# Patient Record
Sex: Female | Born: 1990 | Race: Black or African American | Hispanic: No | Marital: Single | Smoking: Never smoker
Health system: Southern US, Community
[De-identification: ages and names within clinical notes are randomized; demographics above are authoritative.]

## PROBLEM LIST (undated history)

## (undated) DIAGNOSIS — Z9109 Other allergy status, other than to drugs and biological substances: Secondary | ICD-10-CM

---

## 2014-08-27 ENCOUNTER — Emergency Department (HOSPITAL_COMMUNITY): Payer: No Typology Code available for payment source

## 2014-08-27 ENCOUNTER — Emergency Department (HOSPITAL_COMMUNITY)
Admission: EM | Admit: 2014-08-27 | Discharge: 2014-08-27 | Disposition: A | Discharge status: Home / Self Care | Payer: No Typology Code available for payment source | Attending: Emergency Medicine | Admitting: Emergency Medicine

## 2014-08-27 ENCOUNTER — Encounter (HOSPITAL_COMMUNITY): Payer: Self-pay | Admitting: Emergency Medicine

## 2014-08-27 DIAGNOSIS — Z9981 Dependence on supplemental oxygen: Secondary | ICD-10-CM | POA: Insufficient documentation

## 2014-08-27 DIAGNOSIS — S42032A Displaced fracture of lateral end of left clavicle, initial encounter for closed fracture: Secondary | ICD-10-CM | POA: Insufficient documentation

## 2014-08-27 DIAGNOSIS — Z3202 Encounter for pregnancy test, result negative: Secondary | ICD-10-CM | POA: Insufficient documentation

## 2014-08-27 DIAGNOSIS — S20312A Abrasion of left front wall of thorax, initial encounter: Secondary | ICD-10-CM | POA: Insufficient documentation

## 2014-08-27 DIAGNOSIS — S32059A Unspecified fracture of fifth lumbar vertebra, initial encounter for closed fracture: Secondary | ICD-10-CM | POA: Diagnosis not present

## 2014-08-27 DIAGNOSIS — S2232XA Fracture of one rib, left side, initial encounter for closed fracture: Secondary | ICD-10-CM | POA: Diagnosis not present

## 2014-08-27 DIAGNOSIS — Y9389 Activity, other specified: Secondary | ICD-10-CM | POA: Diagnosis not present

## 2014-08-27 DIAGNOSIS — Y92410 Unspecified street and highway as the place of occurrence of the external cause: Secondary | ICD-10-CM | POA: Insufficient documentation

## 2014-08-27 DIAGNOSIS — S42002A Fracture of unspecified part of left clavicle, initial encounter for closed fracture: Secondary | ICD-10-CM

## 2014-08-27 DIAGNOSIS — S32050A Wedge compression fracture of fifth lumbar vertebra, initial encounter for closed fracture: Secondary | ICD-10-CM

## 2014-08-27 DIAGNOSIS — S3992XA Unspecified injury of lower back, initial encounter: Secondary | ICD-10-CM | POA: Diagnosis present

## 2014-08-27 LAB — COMPREHENSIVE METABOLIC PANEL
ALT: 30 U/L (ref 0–35)
AST: 54 U/L — ABNORMAL HIGH (ref 0–37)
Albumin: 4.2 g/dL (ref 3.5–5.2)
Alkaline Phosphatase: 45 U/L (ref 39–117)
Anion gap: 14 (ref 5–15)
BILIRUBIN TOTAL: 0.5 mg/dL (ref 0.3–1.2)
BUN: 12 mg/dL (ref 6–23)
CO2: 22 mEq/L (ref 19–32)
Calcium: 8.7 mg/dL (ref 8.4–10.5)
Chloride: 104 mEq/L (ref 96–112)
Creatinine, Ser: 0.55 mg/dL (ref 0.50–1.10)
GFR calc Af Amer: >90 mL/min (ref >90)
GFR calc non Af Amer: >90 mL/min (ref >90)
Glucose, Bld: 91 mg/dL (ref 70–99)
POTASSIUM: 3.9 meq/L (ref 3.7–5.3)
SODIUM: 140 meq/L (ref 137–147)
Total Protein: 8.3 g/dL (ref 6.0–8.3)

## 2014-08-27 LAB — CBC WITH DIFFERENTIAL/PLATELET
BASOS ABS: 0 10*3/uL (ref 0.0–0.1)
Basophils Relative: 0 % (ref 0–1)
EOS ABS: 0 10*3/uL (ref 0.0–0.7)
Eosinophils Relative: 0 % (ref 0–5)
HCT: 34 % — ABNORMAL LOW (ref 36.0–46.0)
Hemoglobin: 11 g/dL — ABNORMAL LOW (ref 12.0–15.0)
Lymphocytes Relative: 10 % — ABNORMAL LOW (ref 12–46)
Lymphs Abs: 0.9 10*3/uL (ref 0.7–4.0)
MCH: 29.2 pg (ref 26.0–34.0)
MCHC: 32.4 g/dL (ref 30.0–36.0)
MCV: 90.2 fL (ref 78.0–100.0)
Monocytes Absolute: 0.3 10*3/uL (ref 0.1–1.0)
Monocytes Relative: 3 % (ref 3–12)
NEUTROS PCT: 87 % — AB (ref 43–77)
Neutro Abs: 8.5 10*3/uL — ABNORMAL HIGH (ref 1.7–7.7)
PLATELETS: 200 10*3/uL (ref 150–400)
RBC: 3.77 MIL/uL — ABNORMAL LOW (ref 3.87–5.11)
RDW: 12 % (ref 11.5–15.5)
WBC: 9.6 10*3/uL (ref 4.0–10.5)

## 2014-08-27 LAB — URINALYSIS, ROUTINE W REFLEX MICROSCOPIC
Bilirubin Urine: NEGATIVE
GLUCOSE, UA: NEGATIVE mg/dL
KETONES UR: 40 mg/dL — AB
Nitrite: NEGATIVE
PROTEIN: 30 mg/dL — AB
Specific Gravity, Urine: 1.03 (ref 1.005–1.030)
UROBILINOGEN UA: 1 mg/dL (ref 0.0–1.0)
pH: 5.5 (ref 5.0–8.0)

## 2014-08-27 LAB — URINE MICROSCOPIC-ADD ON

## 2014-08-27 LAB — POC URINE PREG, ED: Preg Test, Ur: NEGATIVE

## 2014-08-27 MED ORDER — IOHEXOL 300 MG/ML  SOLN
100.0000 mL | Freq: Once | INTRAMUSCULAR | Status: AC | PRN
  Administered 2014-08-27: 100 mL via INTRAVENOUS

## 2014-08-27 MED ORDER — HYDROCODONE-ACETAMINOPHEN 5-325 MG PO TABS
1.0000 | ORAL_TABLET | Freq: Once | ORAL | Status: AC
  Administered 2014-08-27: 1 via ORAL
  Filled 2014-08-27: qty 1

## 2014-08-27 MED ORDER — OXYCODONE-ACETAMINOPHEN 5-325 MG PO TABS: 1.0000 | ORAL_TABLET | ORAL | Status: DC | PRN

## 2014-08-27 MED ORDER — HYDROMORPHONE HCL 1 MG/ML IJ SOLN
1.0000 mg | Freq: Once | INTRAMUSCULAR | Status: DC
  Filled 2014-08-27: qty 1

## 2014-08-27 MED ORDER — SODIUM CHLORIDE 0.9 % IV BOLUS (SEPSIS)
1000.0000 mL | Freq: Once | INTRAVENOUS | Status: AC
  Administered 2014-08-27: 1000 mL via INTRAVENOUS
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
## 2014-08-27 NOTE — ED Notes (Signed)
Patient is aware that we need a urine specimen- will try again soon
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
## 2014-08-27 NOTE — ED Notes (Signed)
Ortho called 

## 2014-08-27 NOTE — ED Notes (Signed)
Patient transported to CT 

## 2014-08-27 NOTE — ED Notes (Signed)
Pt in xray.  Per friend, pain has not improved.

## 2014-08-27 NOTE — Discharge Instructions (Signed)
Back, Compression Fracture A compression fracture happens when a force is put upon the length of your spine. Slipping and falling on your bottom are examples of such a force. When this happens, sometimes the force is great enough to compress the building blocks (vertebral bodies) of your spine. Although this causes a lot of pain, this can usually be treated at home, unless your caregiver feels hospitalization is needed for pain control. Your backbone (spinal column) is made up of 24 main vertebral bodies in addition to the sacrum and coccyx (see illustration). These are held together by tough fibrous tissues (ligaments) and by support of your muscles. Nerve roots pass through the openings between the vertebrae. A sudden wrenching move, injury, or a fall may cause a compression fracture of one of the vertebral bodies. This may result in back pain or spread of pain into the belly (abdomen), the buttocks, and down the leg into the foot. Pain may also be created by muscle spasm alone. Large studies have been undertaken to determine the best possible course of action to help your back following injury and also to prevent future problems. The recommendations are as follows. FOLLOWING A COMPRESSION FRACTURE: Do the following only if advised by your caregiver.   If a back brace has been suggested or provided, wear it as directed.  Do not stop wearing the back brace unless instructed by your caregiver.  When allowed to return to regular activities, avoid a sedentary lifestyle. Actively exercise. Sporadic weekend binges of tennis, racquetball, or waterskiing may actually aggravate or create problems, especially if you are not in condition for that activity.  Avoid sports requiring sudden body movements until you are in condition for them. Swimming and walking are safer activities.  Maintain good posture.  Avoid obesity.  If not already done, you should have a DEXA scan. Based on the results, be treated for  osteoporosis. FOLLOWING ACUTE (SUDDEN) INJURY:  Only take over-the-counter or prescription medicines for pain, discomfort, or fever as directed by your caregiver.  Use bed rest for only the most extreme acute episode. Prolonged bed rest may aggravate your condition. Ice used for acute conditions is effective. Use a large plastic bag filled with ice. Wrap it in a towel. This also provides excellent pain relief. This may be continuous. Or use it for 30 minutes every 2 hours during acute phase, then as needed. Heat for 30 minutes prior to activities is helpful.  As soon as the acute phase (the time when your back is too painful for you to do normal activities) is over, it is important to resume normal activities and work Tourist information centre manager. Back injuries can cause potentially marked changes in lifestyle. So it is important to attack these problems aggressively.  See your caregiver for continued problems. He or she can help or refer you for appropriate exercises, physical therapy, and work hardening if needed.  If you are given narcotic medications for your condition, for the next 24 hours do not:  Drive.  Operate machinery or power tools.  Sign legal documents.  Do not drink alcohol, or take sleeping pills or other medications that may interfere with treatment. If your caregiver has given you a follow-up appointment, it is very important to keep that appointment. Not keeping the appointment could result in a chronic or permanent injury, pain, and disability. If there is any problem keeping the appointment, you must call back to this facility for assistance.  SEEK IMMEDIATE MEDICAL CARE IF:  You develop numbness,  tingling, weakness, or problems with the use of your arms or legs.  You develop severe back pain not relieved with medications.  You have changes in bowel or bladder control.  You have increasing pain in any areas of the body. Document Released: 10/13/2005 Document Revised:  02/27/2014 Document Reviewed: 05/17/2008 [facility] PartnershipExitCare Patient Information 2015 [place], [place]. This information is not intended to replace advice given to you by your health care provider. Make sure you discuss any questions you have with your health care provider.  Clavicle Fracture The clavicle, also called the collarbone, is the long bone that connects your shoulder to your rib cage. You can feel your collarbone at the top of your shoulders and rib cage. A clavicle fracture is a broken clavicle. It is a common injury that can happen at any age.  CAUSES Common causes of a clavicle fracture include:  A direct blow to your shoulder.  A car accident.  A fall, especially if you try to break your fall with an outstretched arm. RISK FACTORS You may be at increased risk if:  You are younger than 25 years or older than 75 years. Most clavicle fractures happen to people who are younger than 25 years.  You are a female.  You play contact sports. SIGNS AND SYMPTOMS A fractured clavicle is painful. It also makes it hard to move your arm. Other signs and symptoms may include:  A shoulder that drops downward and forward.  Pain when trying to lift your shoulder.  Bruising, swelling, and tenderness over your clavicle.  A grinding noise when you try to move your shoulder.  A bump over your clavicle. DIAGNOSIS Your health care provider can usually diagnose a clavicle fracture by asking about your injury and examining your shoulder and clavicle. He or she may take an X-ray to determine the position of your clavicle. TREATMENT Treatment depends on the position of your clavicle after the fracture:  If the broken ends of the bone are not out of place, your health care provider may put your arm in a sling or wrap a support bandage around your chest (figure-of-eight wrap).  If the broken ends of the bone are out of place, you may need surgery. Surgery may involve placing screws, pins, or plates to keep  your clavicle stable while it heals. Healing may take about 3 months. When your health care provider thinks your fracture has healed enough, you may have to do physical therapy to regain normal movement and build up your arm strength. HOME CARE INSTRUCTIONS   Apply ice to the injured area:  Put ice in a plastic bag.  Place a towel between your skin and the bag.  Leave the ice on for 20 minutes, 2-3 times a day.  If you have a wrap or splint:  Wear it all the time, and remove it only to take a bath or shower.  When you bathe or shower, keep your shoulder in the same position as when the sling or wrap is on.  Do not lift your arm.  If you have a figure-of-eight wrap:  Another person must tighten it every day.  It should be tight enough to hold your shoulders back.  Allow enough room to place your index finger between your body and the strap.  Loosen the wrap immediately if you feel numbness or tingling in your hands.  Only take medicines as directed by your health care provider.  Avoid activities that make the injury or pain worse for 4-6  weeks after surgery.  Keep all follow-up appointments. SEEK MEDICAL CARE IF:  Your medicine is not helping to relieve pain and swelling. SEEK IMMEDIATE MEDICAL CARE IF:  Your arm is numb, cold, or pale, even when the splint is loose. MAKE SURE YOU:   Understand these instructions.  Will watch your condition.  Will get help right away if you are not doing well or get worse. Document Released: 07/23/2005 Document Revised: 10/18/2013 Document Reviewed: 09/05/2013 [facility]trExitCare Patient Information 2015 [place], [place]. This information is not intended to replace advice given to you by your health care provider. Make sure you discuss any questions you have with your health care provider.  Rib Fracture A rib fracture is a break or crack in one of the bones of the ribs. The ribs are a group of long, curved bones that wrap around your chest and  attach to your spine. They protect your lungs and other organs in the chest cavity. A broken or cracked rib is often painful, but most do not cause other problems. Most rib fractures heal on their own over time. However, rib fractures can be more serious if multiple ribs are broken or if broken ribs move out of place and push against other structures. CAUSES   A direct blow to the chest. For example, this could happen during contact sports, a car accident, or a fall against a hard object.  Repetitive movements with high force, such as pitching a baseball or having severe coughing spells. SYMPTOMS   Pain when you breathe in or cough.  Pain when someone presses on the injured area. DIAGNOSIS  Your caregiver will perform a physical exam. Various imaging tests may be ordered to confirm the diagnosis and to look for related injuries. These tests may include a chest X-ray, computed tomography (CT), magnetic resonance imaging (MRI), or a bone scan. TREATMENT  Rib fractures usually heal on their own in 1-3 months. The longer healing period is often associated with a continued cough or other aggravating activities. During the healing period, pain control is very important. Medication is usually given to control pain. Hospitalization or surgery may be needed for more severe injuries, such as those in which multiple ribs are broken or the ribs have moved out of place.  HOME CARE INSTRUCTIONS   Avoid strenuous activity and any activities or movements that cause pain. Be careful during activities and avoid bumping the injured rib.  Gradually increase activity as directed by your caregiver.  Only take over-the-counter or prescription medications as directed by your caregiver. Do not take other medications without asking your caregiver first.  Apply ice to the injured area for the first 1-2 days after you have been treated or as directed by your caregiver. Applying ice helps to reduce inflammation and  pain.  Put ice in a plastic bag.  Place a towel between your skin and the bag.   Leave the ice on for 15-20 minutes at a time, every 2 hours while you are awake.  Perform deep breathing as directed by your caregiver. This will help prevent pneumonia, which is a common complication of a broken rib. Your caregiver may instruct you to:  Take deep breaths several times a day.  Try to cough several times a day, holding a pillow against the injured area.  Use a device called an incentive spirometer to practice deep breathing several times a day.  Drink enough fluids to keep your urine clear or pale yellow. This will help you avoid constipation.  Do not wear a rib belt or binder. These restrict breathing, which can lead to pneumonia.   °SEEK IMMEDIATE MEDICAL CARE IF:  °· You have a fever.   °· You have difficulty breathing or shortness of breath.   °· You develop a continual cough, or you cough up thick or bloody sputum. °· You feel sick to your stomach (nausea), throw up (vomit), or have abdominal pain.   °· You have worsening pain not controlled with medications.   °MAKE SURE YOU: °· Understand these instructions. °· Will watch your condition. °· Will get help right away if you are not doing well or get worse. °Document Released: 10/13/2005 Document Revised: 06/15/2013 Document Reviewed: 12/15/2012 °ExitCare® Patient Information ©2015 [employer]. This information is not intended to replace advice given to you by your health care provider. Make sure you discuss any questions you have with your health care provider. ° °

## 2014-08-27 NOTE — ED Notes (Signed)
Patient refuses all pain medication. Asked multiple times. AVS explained in detail. No other questions/concerns. Arm sling in place. Lumbar corset in place. Knows not to drink/drive/operate heavy machinery/ take extra tylenol with medication. Ambulatory.

## 2014-08-27 NOTE — ED Notes (Signed)
Biomed at bedside with corsett

## 2015-03-16 ENCOUNTER — Emergency Department (HOSPITAL_COMMUNITY)
Admission: EM | Admit: 2015-03-16 | Discharge: 2015-03-16 | Disposition: A | Discharge status: Home / Self Care | Payer: BC Managed Care – PPO | Attending: Emergency Medicine | Admitting: Emergency Medicine

## 2015-03-16 ENCOUNTER — Encounter (HOSPITAL_COMMUNITY): Payer: Self-pay | Admitting: *Deleted

## 2015-03-16 DIAGNOSIS — J029 Acute pharyngitis, unspecified: Secondary | ICD-10-CM | POA: Diagnosis present

## 2015-03-16 DIAGNOSIS — R590 Localized enlarged lymph nodes: Secondary | ICD-10-CM | POA: Diagnosis not present

## 2015-03-16 LAB — RAPID STREP SCREEN (MED CTR MEBANE ONLY): Streptococcus, Group A Screen (Direct): NEGATIVE

## 2015-03-16 MED ORDER — CEPHALEXIN 500 MG PO CAPS: 500.0000 mg | ORAL_CAPSULE | Freq: Four times a day (QID) | ORAL | Status: DC

## 2015-03-16 NOTE — ED Notes (Signed)
Patient states she has had cold like symptoms but on Wednesday she notice a lump to her left neck and a sore throat. 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 She denies any GI symptoms.

## 2015-03-16 NOTE — ED Provider Notes (Signed)
CSN: [id number]     Arrival date & time 03/16/15  1728 History  This chart was scribed for non-physician practitioner working, Elson AreasLeslie K Sofia, PA-C, with Lorre NickAnthony Allen, MD, by Modena JanskyAlbert Thayil, ED Scribe. This patient was seen in room WTR6/WTR6 and the patient's care was started at 6:19 PM.   Chief Complaint  Patient presents with  . Sore Throat  . Mass   The history is provided by the patient. No language interpreter was used.   HPI Comments: Julia Thornton is a 24 y.o. female who presents to the Emergency Department complaining of a constant moderate sore that started 2 days ago. She states that she has been having URI symptoms and then started having a sore throat 2 days ago. She reports that she also noticed a lump to the left side of her neck also 2 days ago. She reports no modifying factors. She denies any recent scalp lacerations or other sources of irritation, along with fever, chills, or ear pain.   History reviewed. No pertinent past medical history. History reviewed. No pertinent past surgical history. History reviewed. No pertinent family history. History  Substance Use Topics  . Smoking status: Never Smoker   . Smokeless tobacco: Not on file  . Alcohol Use: Yes   OB History    No data available     Review of Systems  Constitutional: Negative for fever and chills.  HENT: Positive for sore throat. Negative for ear pain.   Skin: Negative for wound.  All other systems reviewed and are negative.   Allergies  Review of patient's allergies indicates no known allergies.  Home Medications   Prior to Admission medications   Medication Sig Start Date End Date Taking? Authorizing Provider  oxyCODONE-acetaminophen (PERCOCET) 5-325 MG per tablet Take 1 tablet by mouth every 4 (four) hours as needed. Patient not taking: Reported on 03/16/2015 08/27/14   Gilda Creasehristopher J Pollina, MD   BP 125/85 mmHg  Pulse 88  Temp(Src) 98.2 F (36.8 C) (Oral)  Resp 18  SpO2 100%  LMP  02/25/2015 Physical Exam  Constitutional: She is oriented to person, place, and time. She appears well-developed and well-nourished. No distress.  HENT:  Head: Normocephalic and atraumatic.  Neck: Neck supple. No tracheal deviation present.  1.5 cm enlarged left anterior cervical lymph node that is tender.   Cardiovascular: Normal rate.   Pulmonary/Chest: Effort normal. No respiratory distress.  Musculoskeletal: Normal range of motion.  Neurological: She is alert and oriented to person, place, and time.  Skin: Skin is warm and dry.  Psychiatric: She has a normal mood and affect. Her behavior is normal.  Nursing note and vitals reviewed.   ED Course  Procedures (including critical care time) DIAGNOSTIC STUDIES: Oxygen Saturation is 100% on RA, normal by my interpretation.    COORDINATION OF CARE: 6:23 PM- Pt advised of plan for treatment which includes labs and pt agrees.  Labs Review Labs Reviewed  RAPID STREP SCREEN    Imaging Review No results found.   EKG Interpretation None      MDM   Final diagnoses:  Lymphadenopathy, cervical    Pt counseled on need for recheck in 1 week Rx for keflex Return if any problems AVS  I personally performed the services in this documentation, which was scribed in my presence.  The recorded information has been reviewed and considered.   Barnet PallKaren SofiaPAC.   Lonia SkinnerLeslie K CarlsbadSofia, PA-C 03/16/15 1947  Lorre NickAnthony Allen, MD 03/16/15 [phone]

## 2015-03-16 NOTE — Discharge Instructions (Signed)

## 2015-03-22 LAB — CULTURE, GROUP A STREP
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
## 2018-12-03 ENCOUNTER — Emergency Department (HOSPITAL_COMMUNITY)
Admission: EM | Admit: 2018-12-03 | Discharge: 2018-12-04 | Disposition: A | Discharge status: Home / Self Care | Payer: BC Managed Care – PPO | Attending: Emergency Medicine | Admitting: Emergency Medicine

## 2018-12-03 ENCOUNTER — Other Ambulatory Visit: Payer: Self-pay

## 2018-12-03 DIAGNOSIS — T7840XA Allergy, unspecified, initial encounter: Secondary | ICD-10-CM | POA: Diagnosis not present

## 2018-12-03 MED ORDER — METHYLPREDNISOLONE SODIUM SUCC 125 MG IJ SOLR
125.0000 mg | Freq: Once | INTRAMUSCULAR | Status: AC
  Administered 2018-12-03: 125 mg via INTRAVENOUS
  Filled 2018-12-03: qty 2

## 2018-12-03 MED ORDER — FAMOTIDINE IN NACL 20-0.9 MG/50ML-% IV SOLN
20.0000 mg | Freq: Once | INTRAVENOUS | Status: AC
  Administered 2018-12-03: 20 mg via INTRAVENOUS
  Filled 2018-12-03: qty 50

## 2018-12-03 MED ORDER — HYDROXYZINE HCL 25 MG PO TABS
25.0000 mg | ORAL_TABLET | Freq: Once | ORAL | Status: AC
  Administered 2018-12-03: 25 mg via ORAL
  Filled 2018-12-03: qty 1

## 2018-12-03 MED ORDER — DIPHENHYDRAMINE HCL 50 MG/ML IJ SOLN
25.0000 mg | Freq: Once | INTRAMUSCULAR | Status: AC
  Administered 2018-12-03: 25 mg via INTRAVENOUS
  Filled 2018-12-03: qty 1

## 2018-12-03 NOTE — ED Provider Notes (Signed)
[facility]-EMERGENCY DEPT Provider Note   CSN: [id number] Arrival date & time: 12/03/18  2222     History   Chief Complaint No chief complaint on file.   HPI Julia Thornton is a 28 y.o. female who presents to the emergency department with a chief complaint of allergic reaction.  The patient endorses significant swelling to her face that began approximately 30 minutes prior to arrival.  Her bilateral eyes are swollen shut.  She reports that she [place] dinner [place] restaurant and had Dmitriy sauce and oysters for the first time.  No history of allergic reactions, food allergies, or allergies to medication.  A friend reports that she took approximately 50 mg of Benadryl prior to arrival and reports that her right eye swelling appears to have improved since taking Benadryl.  She denies fever, chills, tongue swelling, lip swelling or tingling, throat closing, shortness of breath, rash, nausea, vomiting, or abdominal pain.   No new soaps, lotions, detergents, or hygiene products.   The history is provided by the patient and a friend. No language interpreter was used.    No past medical history on file.  There are no active problems to display for this patient.   No past surgical history on file.   OB History   No obstetric history on file.      Home Medications    Prior to Admission medications   Medication Sig Start Date End Date Taking? Authorizing Provider  EPINEPHrine (EPIPEN 2-PAK) 0.3 mg/0.3 mL IJ SOAJ injection Inject 0.3 mLs (0.3 mg total) into the muscle as needed for anaphylaxis. 12/04/18   McDonald, Mia A, PA-C  famotidine (PEPCID) 20 MG tablet Take 1 tablet (20 mg total) by mouth 2 (two) times daily. 12/04/18   McDonald, Mia A, PA-C    Family History No family history on file.  Social History Social History   Tobacco Use  . Smoking status: Never Smoker  Substance Use Topics  . Alcohol use: Yes  . Drug use: No     Allergies     Patient has no known allergies.   Review of Systems Review of Systems  Constitutional: Negative for activity change, chills, diaphoresis and fever.  HENT: Positive for congestion and facial swelling. Negative for sore throat, trouble swallowing and voice change.   Respiratory: Negative for apnea, cough, shortness of breath and wheezing.   Cardiovascular: Negative for chest pain, palpitations and leg swelling.  Gastrointestinal: Negative for abdominal pain, diarrhea and nausea.  Genitourinary: Negative for dysuria.  Musculoskeletal: Negative for back pain.  Skin: Negative for rash.  Allergic/Immunologic: Negative for immunocompromised state.  Neurological: Negative for headaches.  Psychiatric/Behavioral: Negative for confusion. The patient is nervous/anxious.      Physical Exam Updated Vital Signs BP 116/74 (BP Location: Left Arm)   Pulse 68   Resp 15   Ht 5\' 8"  (1.727 m)   Wt 56.7 kg   SpO2 100%   BMI 19.01 kg/m   Physical Exam Vitals signs and nursing note reviewed.  Constitutional:      General: She is not in acute distress. HENT:     Head: Normocephalic.     Comments: Bilateral eyes are swollen shut.  Inferior periorbital edema to the right eye is decreased when compared to the left eye.  Mild swelling to the bilateral cheeks.  Posterior oropharynx is patent.  Uvula is midline.    Mouth/Throat:     Mouth: No angioedema.     Pharynx:  Oropharynx is clear.  Eyes:     Conjunctiva/sclera: Conjunctivae normal.  Neck:     Musculoskeletal: Neck supple.  Cardiovascular:     Rate and Rhythm: Normal rate and regular rhythm.     Heart sounds: No murmur. No friction rub. No gallop.   Pulmonary:     Effort: Pulmonary effort is normal. No respiratory distress.     Breath sounds: No stridor. No wheezing, rhonchi or rales.     Comments: Lungs are clear to auscultation bilaterally. Chest:     Chest wall: No tenderness.  Abdominal:     General: There is no distension.      Palpations: Abdomen is soft. There is no mass.     Tenderness: There is no abdominal tenderness. There is no right CVA tenderness, left CVA tenderness, guarding or rebound.     Hernia: No hernia is present.     Comments: Abdomen is soft, nontender, nondistended.  Skin:    General: Skin is warm.     Capillary Refill: Capillary refill takes less than 2 seconds.     Findings: No rash.     Comments: No rashes  Neurological:     Mental Status: She is alert.  Psychiatric:        Behavior: Behavior normal.     Comments: Appears anxious    ED Treatments / Results  Labs (all labs ordered are listed, but only abnormal results are displayed) Labs Reviewed - No data to display  EKG EKG Interpretation  Date/Time:  Friday December 03 2018 22:29:57 EST Ventricular Rate:  88 PR Interval:    QRS Duration: 99 QT Interval:  386 QTC Calculation: 467 R Axis:   86 Text Interpretation:  Sinus rhythm Probable left atrial enlargement No old tracing to compare Confirmed by Mancel Bale [phone]) on 12/03/2018 10:43:56 PM   Radiology No results found.  Procedures Procedures (including critical care time)  Medications Ordered in ED Medications  diphenhydrAMINE (BENADRYL) injection 25 mg (25 mg Intravenous Given 12/03/18 2243)  methylPREDNISolone sodium succinate (SOLU-MEDROL) 125 mg/2 mL injection 125 mg (125 mg Intravenous Given 12/03/18 2243)  famotidine (PEPCID) IVPB 20 mg premix (0 mg Intravenous Stopped 12/03/18 2314)  hydrOXYzine (ATARAX/VISTARIL) tablet 25 mg (25 mg Oral Given 12/03/18 2304)  diphenhydrAMINE (BENADRYL) injection 25 mg (25 mg Intravenous Given 12/04/18 0135)  loratadine (CLARITIN) tablet 10 mg (10 mg Oral Given 12/04/18 0135)     Initial Impression / Assessment and Plan / ED Course  I have reviewed the triage vital signs and the nursing notes.  Pertinent labs & imaging results that were available during my care of the patient were reviewed by me and considered in my medical  decision making (see chart for details).     28 year old female presenting with allergic reaction.  No angioedema, or pulmonary, GI system involvements.  Bilateral eyes are swollen shut.  Posterior oropharynx is patent.  She reports pruritus prior to arrival, which has resolved. She was given 50 mg of Benadryl prior to evaluation in the ED and was given an additional 25 IV along with Pepcid and Solu-Medrol.  Atarax given for anxiety.  EKG with sinus rhythm.  On reevaluation, the patient appears much more comfortable.  Swelling of the right eye has improved.  Left eye is still swollen shut.  Will add another 25 of Benadryl and Claritin.  On second evaluation, swelling of the right eye has almost completely resolved and she is opening her eye without difficulty.  Swelling of the left  eye has significantly improved to allow her to open her eye more than 75%.  She has been observed in the ER for 5 hours with no worsening symptoms.  Low suspicion for anaphylaxis or rebound symptoms at this time.  We will discharge the patient home with Benadryl, Pepcid, and EpiPen's.  She should also follow-up with her primary care provider for reevaluation since her inciting allergen is unknown at this time.  Strict return precautions given.  She is hemodynamically stable and in no acute distress.  She is safe for discharge home with outpatient follow-up at this time.  Final Clinical Impressions(s) / ED Diagnoses   Final diagnoses:  Allergic reaction, initial encounter    ED Discharge Orders         Ordered    EPINEPHrine (EPIPEN 2-PAK) 0.3 mg/0.3 mL IJ SOAJ injection  As needed     12/04/18 0316    famotidine (PEPCID) 20 MG tablet  2 times daily     12/04/18 0316           Frederik PearMcDonald, Mia A, PA-C 12/04/18 0325    Benjiman CorePickering, Nathan, MD 12/06/18 [phone]

## 2018-12-04 MED ORDER — DIPHENHYDRAMINE HCL 50 MG/ML IJ SOLN
25.0000 mg | Freq: Once | INTRAMUSCULAR | Status: AC
  Administered 2018-12-04: 25 mg via INTRAVENOUS
  Filled 2018-12-04: qty 1

## 2018-12-04 MED ORDER — LORATADINE 10 MG PO TABS
10.0000 mg | ORAL_TABLET | Freq: Once | ORAL | Status: AC
  Administered 2018-12-04: 10 mg via ORAL
  Filled 2018-12-04: qty 1

## 2018-12-04 MED ORDER — EPINEPHRINE 0.3 MG/0.3ML IJ SOAJ: 0.3000 mg | INTRAMUSCULAR | 0 refills | Status: DC | PRN

## 2018-12-04 MED ORDER — FAMOTIDINE 20 MG PO TABS: 20.0000 mg | ORAL_TABLET | Freq: Two times a day (BID) | ORAL | 0 refills | Status: AC

## 2018-12-04 NOTE — Discharge Instructions (Addendum)
Thank you for allowing me to care for you today in the Emergency Department.   For Benadryl, you can take 25 mg every 4-6 hours or 50 mg every 6-8 hours as needed until your swelling resolves.  You can also take 20 milligrams of Pepcid 2 times daily until your swelling resolves.   I have also given you a prescription for an EpiPen.  Information about how to use an EpiPen is attached.  Return to the emergency department if you develop worsening swelling, feeling as if your throat is closing, lip swelling, severe shortness of breath, swelling with nausea, abdominal pain, vomiting, or severe rash, or other new, concerning symptoms.

## 2019-01-06 ENCOUNTER — Ambulatory Visit: Payer: Self-pay | Admitting: Allergy & Immunology

## 2020-02-13 ENCOUNTER — Other Ambulatory Visit: Payer: Self-pay

## 2020-02-13 ENCOUNTER — Encounter (HOSPITAL_COMMUNITY): Payer: Self-pay

## 2020-02-13 DIAGNOSIS — Z5321 Procedure and treatment not carried out due to patient leaving prior to being seen by health care provider: Secondary | ICD-10-CM | POA: Insufficient documentation

## 2020-02-13 DIAGNOSIS — L509 Urticaria, unspecified: Secondary | ICD-10-CM | POA: Diagnosis not present

## 2020-02-13 NOTE — ED Notes (Signed)
No answer when called for vital signs x2 

## 2020-02-13 NOTE — ED Triage Notes (Signed)
Went to gym. Didn't have anything new. Sts reaction has improved while waiting. Arms red with hives. No difficulty breathing or swallowing.

## 2020-02-14 ENCOUNTER — Emergency Department (HOSPITAL_COMMUNITY)
Admission: EM | Admit: 2020-02-14 | Discharge: 2020-02-14 | Disposition: A | Discharge status: Home / Self Care | Payer: BC Managed Care – PPO | Attending: Emergency Medicine | Admitting: Emergency Medicine

## 2020-02-14 NOTE — ED Notes (Signed)
No answer x3

## 2020-06-27 ENCOUNTER — Emergency Department (HOSPITAL_COMMUNITY)
Admission: EM | Admit: 2020-06-27 | Discharge: 2020-06-27 | Disposition: A | Discharge status: Home / Self Care | Payer: BC Managed Care – PPO | Attending: Emergency Medicine | Admitting: Emergency Medicine

## 2020-06-27 ENCOUNTER — Other Ambulatory Visit: Payer: Self-pay

## 2020-06-27 ENCOUNTER — Encounter (HOSPITAL_COMMUNITY): Payer: Self-pay

## 2020-06-27 DIAGNOSIS — T7840XA Allergy, unspecified, initial encounter: Secondary | ICD-10-CM | POA: Diagnosis present

## 2020-06-27 DIAGNOSIS — Z79899 Other long term (current) drug therapy: Secondary | ICD-10-CM | POA: Diagnosis not present

## 2020-06-27 LAB — I-STAT BETA HCG BLOOD, ED (MC, WL, AP ONLY): I-stat hCG, quantitative: <5 m[IU]/mL (ref <5)

## 2020-06-27 MED ORDER — DEXAMETHASONE SODIUM PHOSPHATE 10 MG/ML IJ SOLN
10.0000 mg | Freq: Once | INTRAMUSCULAR | Status: AC
  Administered 2020-06-27: 10 mg via INTRAVENOUS
  Filled 2020-06-27: qty 1

## 2020-06-27 MED ORDER — FAMOTIDINE IN NACL 20-0.9 MG/50ML-% IV SOLN
20.0000 mg | Freq: Once | INTRAVENOUS | Status: AC
  Administered 2020-06-27: 20 mg via INTRAVENOUS
  Filled 2020-06-27: qty 50

## 2020-06-27 MED ORDER — EPINEPHRINE 0.3 MG/0.3ML IJ SOAJ: 0.3000 mg | INTRAMUSCULAR | 0 refills | Status: DC | PRN

## 2020-06-27 NOTE — ED Triage Notes (Signed)
EMS reports from home, approx 1500 Pt had hives and swelling to face and mouth. Took 50mg  Benedryl PO prior to EMS arrival with effect. Still some swelling back of arms and chest, Pt arrives in no distress.  BP 122/83 HR 78 RR 16 Sp02 98 RA  20ga LAC

## 2020-06-27 NOTE — ED Provider Notes (Signed)
[facility]-EMERGENCY DEPT Provider Note   CSN: [id number] Arrival date & time: 06/27/20  1613     History Chief Complaint  Patient presents with  . Allergic Reaction    Julia Thornton is a 29 y.o. female presenting for evaluation of allergic reaction.  Patient states around 3:00 this afternoon she developed acute onset swelling of her lips and mouth, and rash of her arms, abdomen, back.  She took 50 mg of Benadryl p.o., use a topical Benadryl spray and reports improvement of symptoms.  She no longer has any swelling of her lips or mouth, she continues to have hives on her upper extremities and low back, but the remaining hives have resolved.  She reports at no point did she had difficulty breathing, swallowing, chest pain, nausea, vomiting, or diarrhea.  She does not know what caused this reaction, although she has had 2 similar reactions in the past year.  She has seen an allergist, was found to have multiple allergies.  She has no other medical problems, takes no medications daily.  She denies any close, detergents, soaps, environments, or exposures.  HPI     History reviewed. No pertinent past medical history.  There are no problems to display for this patient.   History reviewed. No pertinent surgical history.   OB History   No obstetric history on file.     History reviewed. No pertinent family history.  Social History   Tobacco Use  . Smoking status: Never Smoker  . Smokeless tobacco: Never Used  Substance Use Topics  . Alcohol use: Yes  . Drug use: No    Home Medications Prior to Admission medications   Medication Sig Start Date End Date Taking? Authorizing Provider  EPINEPHrine (EPIPEN 2-PAK) 0.3 mg/0.3 mL IJ SOAJ injection Inject 0.3 mLs (0.3 mg total) into the muscle as needed for anaphylaxis. 06/27/20   Jahking Lesser, PA-C  famotidine (PEPCID) 20 MG tablet Take 1 tablet (20 mg total) by mouth 2 (two) times daily. 12/04/18   McDonald, Mia  A, PA-C    Allergies    Soy allergy  Review of Systems   Review of Systems  HENT:       Mouth and tongue swelling, resolved  Skin: Positive for rash (urticaria, improving).    Physical Exam Updated Vital Signs BP (!) 135/92   Pulse 87   Temp 98.1 F (36.7 C)   Resp 16   LMP 06/10/2020   SpO2 100%   Physical Exam Vitals and nursing note reviewed.  Constitutional:      General: She is not in acute distress.    Appearance: She is well-developed.     Comments: Sitting in the bed in NAD  HENT:     Head: Normocephalic and atraumatic.     Comments: No obvious mouth, tongue, throat swelling.  No angioedema.  Handling secretions easily.  Airway grossly intact. Eyes:     Extraocular Movements: Extraocular movements intact.     Conjunctiva/sclera: Conjunctivae normal.     Pupils: Pupils are equal, round, and reactive to light.  Cardiovascular:     Rate and Rhythm: Normal rate and regular rhythm.     Pulses: Normal pulses.  Pulmonary:     Effort: Pulmonary effort is normal. No respiratory distress.     Breath sounds: Normal breath sounds. No wheezing.     Comments: Speaking full sentences.  Clear lung sounds in all fields. Abdominal:     General: There is no distension.  Palpations: Abdomen is soft. There is no mass.     Tenderness: There is no abdominal tenderness. There is no guarding or rebound.  Musculoskeletal:        General: Normal range of motion.     Cervical back: Normal range of motion and neck supple.  Skin:    General: Skin is warm and dry.     Capillary Refill: Capillary refill takes less than 2 seconds.     Findings: Rash present.     Comments: Mild urticaria noted of bilateral forearms and low back.  Neurological:     Mental Status: She is alert and oriented to person, place, and time.     ED Results / Procedures / Treatments   Labs (all labs ordered are listed, but only abnormal results are displayed) Labs Reviewed  I-STAT BETA HCG BLOOD, ED  ([facility], [facility], [facility] ONLY)    EKG None  Radiology No results found.  Procedures Procedures (including critical care time)  Medications Ordered in ED Medications  dexamethasone (DECADRON) injection 10 mg (10 mg Intravenous Given 06/27/20 1643)  famotidine (PEPCID) IVPB 20 mg premix (0 mg Intravenous Stopped 06/27/20 1715)    ED Course  I have reviewed the triage vital signs and the nursing notes.  Pertinent labs & imaging results that were available during my care of the patient were reviewed by me and considered in my medical decision making (see chart for details).    MDM Rules/Calculators/A&P                          Patient presenting for evaluation after allergic reaction.  On exam, patient appears nontoxic.  Airway is dominant distress.  Symptoms have improved after Benadryl, but not completely resolved.  As such, will give Decadron and Pepcid.  Will continue to monitor to ensure no significant worsening.  After 1.5  hours of observation, patient with complete resolution of symptoms, no sign of airway compromise.  Discussed continued somatic use of H1 and H2 blockers as needed. Pt requesting refill of epi pen as rx is expired, rx given. Encourage patient to follow-up with allergist.  Encouraged patient to use Benadryl if she has a reaction.  At this time, patient appears safe for discharge.  Return precautions given including signs of anaphylaxis.  Patient states she understands and agrees to plan.   Final Clinical Impression(s) / ED Diagnoses Final diagnoses:  Allergic reaction, initial encounter    Rx / DC Orders ED Discharge Orders         Ordered    EPINEPHrine (EPIPEN 2-PAK) 0.3 mg/0.3 mL IJ SOAJ injection  As needed        06/27/20 1740           Azim Gillingham, PA-C 06/27/20 Crissie Figures, MD 06/27/20 2220

## 2020-06-27 NOTE — Discharge Instructions (Addendum)
Use Benadryl and Pepcid as needed if rash returns. Return to the emergency room immediately if you develop difficulty breathing, swelling of your mouth or tongue, vomiting/diarrhea, or any new, worsening, or concerning symptoms.

## 2021-12-25 ENCOUNTER — Emergency Department (HOSPITAL_COMMUNITY)
Admission: EM | Admit: 2021-12-25 | Discharge: 2021-12-25 | Disposition: A | Discharge status: Home / Self Care | Payer: BC Managed Care – PPO | Attending: Emergency Medicine | Admitting: Emergency Medicine

## 2021-12-25 ENCOUNTER — Other Ambulatory Visit: Payer: Self-pay

## 2021-12-25 ENCOUNTER — Encounter (HOSPITAL_COMMUNITY): Payer: Self-pay | Admitting: *Deleted

## 2021-12-25 DIAGNOSIS — T7840XA Allergy, unspecified, initial encounter: Secondary | ICD-10-CM | POA: Insufficient documentation

## 2021-12-25 HISTORY — DX: Other allergy status, other than to drugs and biological substances: Z91.09

## 2021-12-25 LAB — POC URINE PREG, ED: Preg Test, Ur: NEGATIVE

## 2021-12-25 MED ORDER — DEXAMETHASONE 4 MG PO TABS
10.0000 mg | ORAL_TABLET | Freq: Once | ORAL | Status: AC
  Administered 2021-12-25: 10 mg via ORAL
  Filled 2021-12-25: qty 1

## 2021-12-25 MED ORDER — EPINEPHRINE 0.3 MG/0.3ML IJ SOAJ: 0.3000 mg | INTRAMUSCULAR | 1 refills | Status: AC | PRN

## 2021-12-25 NOTE — ED Provider Notes (Cosign Needed)
[facility] DEPT Provider Note   CSN: [id number] Arrival date & time: 12/25/21  1526     History  Chief Complaint  Patient presents with   Allergic Reaction    Julia Thornton is a 31 y.o. female who presents today for evaluation of allergic reaction.  She reports that at about 1430 today she felt like her eyes and ears were itching.  At about 1445 she took 3 benadryl (57m) and one pepcid.    When this happened she felt like her throat was swelling.  She felt pain, swelling, and itchiness in her face, her eyes and behind her ears.  She felt like she had hives on her torso and arms.  She did have some mild abdominal pain and felt like she had to use the bathroom but did not have diarrhea or vomit.  She feels like since arrival her eyes are less puffy.  She did have feelings of throat fullness but that resolved PTA.  She denies any shortness of breath, abnormal throat sensation, lip swelling, cough or feeling light headed currently.  Her abnormal sensations in her stomach have resolved.  She states she did not use an EpiPen because hers was expired.  HPI     Home Medications Prior to Admission medications   Medication Sig Start Date End Date Taking? Authorizing Provider  EPINEPHrine 0.3 mg/0.3 mL IJ SOAJ injection Inject 0.3 mg into the muscle as needed for anaphylaxis (After use call 9-11). 12/25/21  Yes HLorin Glass PA-C  EPINEPHrine 0.3 mg/0.3 mL IJ SOAJ injection Inject 0.3 mg into the muscle as needed for anaphylaxis (After using call 9-11). 12/25/21  Yes HLorin Glass PA-C  famotidine (PEPCID) 20 MG tablet Take 1 tablet (20 mg total) by mouth 2 (two) times daily. 12/04/18   McDonald, Mia A, PA-C      Allergies    Soy allergy    Review of Systems   Review of Systems  Physical Exam Updated Vital Signs BP (!) 148/88    Pulse 90    Temp 97.9 F (36.6 C)    Resp 16    Ht '5\' 8"'  (1.727 m)    Wt 63.5 kg    SpO2 100%    BMI 21.29 kg/m   Physical Exam Vitals and nursing note reviewed.  Constitutional:      General: She is not in acute distress.    Appearance: She is not ill-appearing.  HENT:     Head: Atraumatic.     Mouth/Throat:     Mouth: Mucous membranes are moist.     Pharynx: Oropharynx is clear.     Comments: No oropharyngeal edema.  Normal phonation.  No elevation of the floor of the mouth or submandibular swelling.  No lip, tongue or intraoral edema. Eyes:     Conjunctiva/sclera: Conjunctivae normal.     Comments: Initially when I saw her in triage there was significant edema of the both upper and lower eyelids bilaterally.  On recheck when she is placed in a room this has improved significantly, she still has mild edema over the upper eyelids bilaterally but the lower eye edema has fully resolved.  Cardiovascular:     Rate and Rhythm: Normal rate.     Heart sounds: Normal heart sounds.  Pulmonary:     Effort: Pulmonary effort is normal. No respiratory distress.     Breath sounds: Normal breath sounds. No stridor. No wheezing.  Abdominal:     General: There is  no distension.     Tenderness: There is no abdominal tenderness. There is no guarding.  Musculoskeletal:     Cervical back: Normal range of motion and neck supple.     Comments: No obvious acute injury  Skin:    General: Skin is warm and dry.     Findings: No rash.     Comments: No urticarial rash noted.  Neurological:     Mental Status: She is alert.     Comments: Awake and alert, answers all questions appropriately.  Speech is not slurred.  Psychiatric:        Mood and Affect: Mood normal.        Behavior: Behavior normal.    ED Results / Procedures / Treatments   Labs (all labs ordered are listed, but only abnormal results are displayed) Labs Reviewed  POC URINE PREG, ED    EKG None  Radiology No results found.  Procedures Procedures    Medications Ordered in ED Medications  dexamethasone (DECADRON) tablet 10 mg (10 mg Oral  Given 12/25/21 1805)    ED Course/ Medical Decision Making/ A&P Clinical Course as of 12/25/21 2205  Wed Dec 25, 2021  1743 Patient is reevaluated.  Her eyelid swelling has improved significantly.  She still has some residual upper lid swelling.  She states she feels fine. She is here with her best friend who is an Therapist, sports. Through shared decision-making we discussed option for continued observation versus discharge.  At this point she is ready for discharge.  We discussed anaphylaxis versus allergic reaction at extensive length, along with when to use an EpiPen, when to use 911, and appropriate Benadryl dosing. [EH]    Clinical Course User Index [EH] Lorin Glass, PA-C                           Medical Decision Making Patient is a otherwise healthy 31 year old woman who presents today for evaluation of allergic reaction to an unknown substance. Based on her description it sounds like she was in anaphylaxis prior to arrival however since she arrived in the department she has not met criteria for anaphylaxis and has been improving based on the medications she took prior to arrival including Benadryl and Pepcid. She was observed in the emergency room for over 2-1/2 hours, during this time she had significant improvement in her periorbital edema (over 3 hours since the start of the allergic reaction  0.  She did not have any angioedema, and stated that she felt well and wished to go home.  Patient's friend at bedside is a Therapist, sports per patient's report.  Patient's results and care plan were discussed with both patient and her friend at bedside.  Through shared decision making I offered patient continued observation versus discharge home.  She requested discharge home.  EpiPen use and appropriate medication use is discussed.  Her friend who is an Therapist, sports states that she will be with the patient for tonight.  As patient took a total of 75 mg of prior to arrival Benadryl and 20 mg of Pepcid she was not given  additional antihistamines in the department.  She was given a dose of Decadron.  We discussed this is a long-acting steroid and because of this she is not given steroids to go home with. Allergist referral is placed as she had a significant allergic reaction to an unknown substance.   Problems Addressed: Allergic reaction, initial encounter: acute illness or injury  that poses a threat to life or bodily functions  Amount and/or Complexity of Data Reviewed Independent Historian: friend Labs: ordered.    Details: POC U pregnant is negative  Risk OTC drugs. Prescription drug management. Decision regarding hospitalization.  Critical Care Total time providing critical care: < 30 minutes  Return precautions were discussed with patient who states their understanding.  At the time of discharge patient denied any unaddressed complaints or concerns.  Patient is agreeable for discharge home.  In addition to discussing ongoing use of antihistamines and appropriate EpiPen usage I both sent a prescription for EpiPen's to patient's preferred pharmacy and printed 1.  I discussed with her the importance of getting this on her way home tonight so that she has it available if needed.  We also discussed possibility of a biphasic reaction and she states her understanding.  Note: Portions of this report may have been transcribed using voice recognition software. Every effort was made to ensure accuracy; however, inadvertent computerized transcription errors may be present   Final Clinical Impression(s) / ED Diagnoses Final diagnoses:  Allergic reaction, initial encounter    Rx / DC Orders ED Discharge Orders          Ordered    Ambulatory referral to Allergy        12/25/21 1744    EPINEPHrine 0.3 mg/0.3 mL IJ SOAJ injection  As needed        12/25/21 1746    EPINEPHrine 0.3 mg/0.3 mL IJ SOAJ injection  As needed        12/25/21 1747              Lorin Glass, PA-C 12/25/21  2206

## 2021-12-25 NOTE — ED Provider Triage Note (Signed)
Emergency Medicine Provider Triage Evaluation Note ? ?Julia Thornton , a 31 y.o. female  was evaluated in triage.  Pt complains of allergic reaction. This started at about 230pm today.  She reports that she walked into [employer] at school and felt weird.  She reports that she felt like her ears and eyes were itching.  ? ?She took three benadryl and 1 Pepcid at about 245 pm today  ? ?Since getting here she feels like maybe her eyes are less puffy after the meds and her throat fullness has resolved.  No shortness of breath or abnormal throat sensation now.  ? ? ?Physical Exam  ?BP (!) 145/95 (BP Location: Left Arm)   Pulse 100   Ht 5\' 8"  (1.727 m)   Wt 63.5 kg   SpO2 100%   BMI 21.29 kg/m?  ?Gen:   Awake, no distress   ?Resp:  Normal effort  ?MSK:   Moves extremities without difficulty  ?Other:  Facial swelling around eyes.  ? ?Medical Decision Making  ?Medically screening exam initiated at 3:56 PM.  Appropriate orders placed.  Maggee Harshbarger was informed that the remainder of the evaluation will be completed by another provider, this initial triage assessment does not replace that evaluation, and the importance of remaining in the ED until their evaluation is complete. ? ? ?  ?Lorin Glass, [place] ?12/25/21 1605 ? ?

## 2021-12-25 NOTE — Discharge Instructions (Addendum)
Today you were seen and evaluated for an allergic reaction.  I have given you a referral to the allergist.  If you do not hear from them in the next few days then please call the allergist at the information listed above. ? ?If you develop any concerning symptoms, especially feeling like your throat is closing, shortness of breath, swelling of the face, lips or tongue then please return to the emergency room immediately for evaluation.   ? ?At about 7 PM tonight please take a dose of Zyrtec.  This is available over-the-counter.  While the box tells you to take one pill every 24 hours I want you to take one pill every 12 hours for the next few days, in place of any Benadryl.  If after a day or 2 you are feeling well you can try taking it only at night.  If your symptoms do worsen despite this then you can also take a extra 1 pill of Benadryl.  This will make you sleepy and you should not drive, operate heavy machinery or perform any task where if you were to fall asleep or make a slow decision it could cause harm to you or someone else.  ? ?You were given steroids in the emergency room.  This should stay in your system for multiple days. Steroids may give you the feeling of being hot, increased appetite, and extra energy.   ? ?I have given you a prescription for two epi pens.  Please make sure you keep one with you at all times.  Please consider keeping two pills of benadryl with your epi pen and if you use it take the benadryl and call 9-11.  Please do not store epi-pens in your car or allow them to get very hot or cold as this can make them not work as well.   ? ? ?

## 2021-12-25 NOTE — ED Notes (Signed)
Pt has urine down in lab if needed for further testing ?

## 2021-12-25 NOTE — ED Triage Notes (Signed)
Pt states she walked ina room at work, after a brief interval, head started itching, ears, swelling of the eyes, skin flushed. Took 3 Benadryl and 1 Pepcid. No airway difficulty. ? ?UNABLE TO REGISTER TEMP IN TRIAGE. ?
